# Patient Record
Sex: Female | Born: 1980 | Race: White | Hispanic: No | Marital: Single | Smoking: Never smoker
Health system: Southern US, Community
[De-identification: ages and names within clinical notes are randomized; demographics above are authoritative.]

## PROBLEM LIST (undated history)

## (undated) DIAGNOSIS — Z3490 Encounter for supervision of normal pregnancy, unspecified, unspecified trimester: Secondary | ICD-10-CM

## (undated) HISTORY — DX: Encounter for supervision of normal pregnancy, unspecified, unspecified trimester: Z34.90

---

## 2004-11-28 ENCOUNTER — Inpatient Hospital Stay: Payer: Self-pay

## 2006-01-18 ENCOUNTER — Observation Stay: Payer: Self-pay | Admitting: Unknown Physician Specialty

## 2006-01-26 ENCOUNTER — Inpatient Hospital Stay: Payer: Self-pay

## 2012-01-31 ENCOUNTER — Ambulatory Visit: Payer: Self-pay | Admitting: Internal Medicine

## 2012-10-12 HISTORY — PX: BREAST BIOPSY: SHX20

## 2013-02-09 HISTORY — PX: BREAST SURGERY: SHX581

## 2013-02-14 ENCOUNTER — Encounter: Payer: Self-pay | Admitting: General Surgery

## 2013-02-14 ENCOUNTER — Ambulatory Visit (INDEPENDENT_AMBULATORY_CARE_PROVIDER_SITE_OTHER): Payer: BC Managed Care – PPO | Admitting: General Surgery

## 2013-02-14 VITALS — BP 102/72 | HR 74 | Resp 12 | Ht 62.0 in | Wt 134.0 lb

## 2013-02-14 DIAGNOSIS — N63 Unspecified lump in unspecified breast: Secondary | ICD-10-CM | POA: Insufficient documentation

## 2013-02-14 NOTE — Progress Notes (Signed)
Patient ID: Kerry Carter, female   DOB: [birth year], 32 y.o.   MRN: [id number]  Chief Complaint  Patient presents with  . Breast Mass    HPI Kerry Carter is a 32 y.o. female here for evaluation of left breast mass. No studies completed. Patient states the mass was found by Kerry Carter,[name] on a recent check up. Patient stated she cannot feel any mass. She denies any breast symptoms. The only family history of breast cancer is a paternal grandmother and paternal aunt. HPI  Past Medical History  Diagnosis Date  . Pregnancy     History reviewed. No pertinent past surgical history.  Family History  Problem Relation Age of Onset  . Diabetes Father   . Breast cancer Paternal Grandmother 68  . Breast cancer Paternal Aunt 38    Social History History  Substance Use Topics  . Smoking status: Never Smoker   . Smokeless tobacco: Never Used  . Alcohol Use: Yes    Allergies  Allergen Reactions  . Zithromax (Azithromycin) Itching    Current Outpatient Prescriptions  Medication Sig Dispense Refill  . Adapalene-Benzoyl Peroxide (EPIDUO) 0.1-2.5 % gel Apply topically at bedtime.      . Dapsone (ACZONE) 5 % topical gel Apply topically 2 (two) times daily.       No current facility-administered medications for this visit.    Review of Systems Review of Systems  Constitutional: Negative.   HENT: Negative.   Respiratory: Negative.   Cardiovascular: Negative.     Blood pressure 102/72, pulse 74, resp. rate 12, height 5\' 2"  (1.575 m), weight 134 lb (60.782 kg), last menstrual period 01/28/2013.  Physical Exam Physical Exam  Constitutional: She is oriented to person, place, and time. She appears well-developed and well-nourished.  Eyes: Conjunctivae are normal. No scleral icterus.  Neck: Normal range of motion. Neck supple.  Cardiovascular: Normal rate, regular rhythm and normal heart sounds.   Pulmonary/Chest: Effort normal and breath sounds normal. Right breast exhibits no  inverted nipple, no mass, no nipple discharge, no skin change and no tenderness. Left breast exhibits mass (7 and 8 o'clock 3cm from nipple). Left breast exhibits no inverted nipple, no nipple discharge, no skin change and no tenderness.  7 and 8 o'clock 3cm from nipple of left breast. Ill defined slightly firm 2 cm mass mobile and non tender.  Lymphadenopathy:    She has no cervical adenopathy.    She has no axillary adenopathy.  Neurological: She is alert and oriented to person, place, and time.    Data Reviewed Targeted US over palpable mass in left breast LIQ was performed. At the 7-8o'cl location there is a mildly lobulated hypoechoic mass 2.34 by 2.32by 0.9cm size. No associated shadowing or through transmission noted but the mass is overlying a rib. The mass os homogeneous. And margins are smooth.  Assessment    Left breast mass. US showed a hypoechoic mass     Plan   Core biopsy recommended and explained in full to pt. Pt is agreeable. She had another appt today and wished to have it done another day.       Kerry Carter G 02/16/2013, 8:10 PM

## 2013-02-14 NOTE — Patient Instructions (Addendum)
The breast biopsy procedure was reviewed with the patient. The potential for bleeding, infection, and pain was reviewed. At this time, the benefits outweigh the risk and the patient is amenable to proceed.

## 2013-02-16 ENCOUNTER — Encounter: Payer: Self-pay | Admitting: General Surgery

## 2013-02-20 ENCOUNTER — Encounter: Payer: Self-pay | Admitting: General Surgery

## 2013-03-02 ENCOUNTER — Other Ambulatory Visit: Payer: Self-pay

## 2013-03-02 ENCOUNTER — Ambulatory Visit (INDEPENDENT_AMBULATORY_CARE_PROVIDER_SITE_OTHER): Payer: BC Managed Care – PPO | Admitting: General Surgery

## 2013-03-02 ENCOUNTER — Encounter: Payer: Self-pay | Admitting: General Surgery

## 2013-03-02 VITALS — BP 94/64 | HR 74 | Resp 12 | Ht 62.0 in | Wt 135.0 lb

## 2013-03-02 DIAGNOSIS — N63 Unspecified lump in unspecified breast: Secondary | ICD-10-CM

## 2013-03-02 NOTE — Progress Notes (Signed)
Patient ID: Avyana Puffenbarger, female   DOB: 1981/02/08, 32 y.o.   MRN: [id number]  Chief Complaint  Patient presents with  . Procedure    left breast biopsy    HPI Moani Weipert is a 32 y.o. female.  Patient here today for left breast biopsy. HPI  Past Medical History  Diagnosis Date  . Pregnancy     History reviewed. No pertinent past surgical history.  Family History  Problem Relation Age of Onset  . Diabetes Father   . Breast cancer Paternal Grandmother 40  . Breast cancer Paternal Aunt 35    Social History History  Substance Use Topics  . Smoking status: Never Smoker   . Smokeless tobacco: Never Used  . Alcohol Use: Yes    Allergies  Allergen Reactions  . Zithromax (Azithromycin) Itching    Current Outpatient Prescriptions  Medication Sig Dispense Refill  . Adapalene-Benzoyl Peroxide (EPIDUO) 0.1-2.5 % gel Apply topically at bedtime.      . Dapsone (ACZONE) 5 % topical gel Apply topically 2 (two) times daily.       No current facility-administered medications for this visit.    Review of Systems Review of Systems  Constitutional: Negative.   Respiratory: Negative.   Cardiovascular: Negative.     Blood pressure 94/64, pulse 74, resp. rate 12, height 5\' 2"  (1.575 m), weight 135 lb (61.236 kg), last menstrual period 02/27/2013.  Physical Exam Physical Exam  Data Reviewed    Assessment    Left breast mass     Plan    Cor biopsy completed today        Molly Maselli G 03/02/2013, 9:09 AM

## 2013-03-02 NOTE — Patient Instructions (Addendum)

## 2013-03-03 LAB — PATHOLOGY

## 2013-03-07 ENCOUNTER — Telehealth: Payer: Self-pay | Admitting: *Deleted

## 2013-03-07 NOTE — Telephone Encounter (Signed)
Notified patient as instructed that the pathology was benign per Dr Evette Cristal, patient pleased. Discussed follow-up appointments, patient agrees

## 2013-03-09 ENCOUNTER — Ambulatory Visit (INDEPENDENT_AMBULATORY_CARE_PROVIDER_SITE_OTHER): Payer: BC Managed Care – PPO | Admitting: *Deleted

## 2013-03-09 DIAGNOSIS — N63 Unspecified lump in unspecified breast: Secondary | ICD-10-CM

## 2013-03-09 NOTE — Progress Notes (Signed)
Patient here today for follow up post breast biopsy.  Dressing remains in place, steristrip in place and aware it may come off in one week.  Minimal bruising noted.  The patient is aware that a heating pad may be used for comfort as needed.  Aware of pathology. Follow up as scheduled.

## 2013-03-09 NOTE — Patient Instructions (Addendum)
The patient is aware that a heating pad may be used for comfort as needed.  Aware of pathology. Follow up as scheduled.

## 2013-05-03 ENCOUNTER — Other Ambulatory Visit: Payer: Self-pay

## 2013-05-03 ENCOUNTER — Encounter: Payer: Self-pay | Admitting: General Surgery

## 2013-05-03 ENCOUNTER — Ambulatory Visit (INDEPENDENT_AMBULATORY_CARE_PROVIDER_SITE_OTHER): Payer: BC Managed Care – PPO | Admitting: General Surgery

## 2013-05-03 VITALS — BP 86/52 | HR 70 | Resp 12 | Ht 63.0 in | Wt 139.0 lb

## 2013-05-03 DIAGNOSIS — N63 Unspecified lump in unspecified breast: Secondary | ICD-10-CM

## 2013-05-03 NOTE — Patient Instructions (Addendum)
Continue self breast exams. Call office for any new breast issues or concerns. Yearly exam in May 2015

## 2013-05-03 NOTE — Progress Notes (Signed)
Patient ID: Kerry Carter, female   DOB: Dec 12, 1980, 32 y.o.   MRN: [id number]  Chief Complaint  Patient presents with  . Follow-up    HPI Kerry Carter is a 32 y.o. female.  Patient here today for follow up left breast core biopsy done May 2014, benign.  No new breast issues. HPI  Past Medical History  Diagnosis Date  . Pregnancy     Past Surgical History  Procedure Laterality Date  . Breast surgery Left May 2014    benign    Family History  Problem Relation Age of Onset  . Diabetes Father   . Breast cancer Paternal Grandmother 38  . Breast cancer Paternal Aunt 38    Social History History  Substance Use Topics  . Smoking status: Never Smoker   . Smokeless tobacco: Never Used  . Alcohol Use: Yes    Allergies  Allergen Reactions  . Zithromax (Azithromycin) Itching    Current Outpatient Prescriptions  Medication Sig Dispense Refill  . norethindrone-ethinyl estradiol (JUNEL FE,GILDESS FE,LOESTRIN FE) 1-20 MG-MCG tablet Take 1 tablet by mouth daily.      . Adapalene-Benzoyl Peroxide (EPIDUO) 0.1-2.5 % gel Apply topically at bedtime.      . Dapsone (ACZONE) 5 % topical gel Apply topically 2 (two) times daily.       No current facility-administered medications for this visit.    Review of Systems Review of Systems  Constitutional: Negative.   Respiratory: Negative.   Cardiovascular: Negative.     Blood pressure 86/52, pulse 70, resp. rate 12, height 5\' 3"  (1.6 m), weight 139 lb (63.05 kg), last menstrual period 05/01/2013.  Physical Exam Physical Exam  Constitutional: She is oriented to person, place, and time. She appears well-developed and well-nourished.  Pulmonary/Chest: Left breast exhibits no inverted nipple, no mass, no nipple discharge, no skin change and no tenderness.  Neurological: She is alert and oriented to person, place, and time.  Skin: Skin is warm and dry.    Data Reviewed Prior ultrasound  Assessment    Left breast no masses     Plan    Yearly follow up in May       [facility] G 05/03/2013, 7:19 PM

## 2014-02-06 ENCOUNTER — Ambulatory Visit (INDEPENDENT_AMBULATORY_CARE_PROVIDER_SITE_OTHER): Payer: BC Managed Care – PPO | Admitting: General Surgery

## 2014-02-06 ENCOUNTER — Encounter: Payer: Self-pay | Admitting: General Surgery

## 2014-02-06 ENCOUNTER — Other Ambulatory Visit: Payer: BC Managed Care – PPO

## 2014-02-06 VITALS — BP 110/70 | HR 76 | Resp 12 | Ht 63.0 in | Wt 144.0 lb

## 2014-02-06 DIAGNOSIS — N63 Unspecified lump in unspecified breast: Secondary | ICD-10-CM

## 2014-02-06 NOTE — Progress Notes (Signed)
Patient ID: Kerry Carter, female   DOB: 1981/01/08, 33 y.o.   MRN: [id number]  Chief Complaint  Patient presents with  . Follow-up    HPI Kerry Carter is a 33 y.o. female.  who presents for a follow up breast evaluation.  Patient does perform regular self breast checks.  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 Denies any new breast issues.  HPI  Past Medical History  Diagnosis Date  . Pregnancy     Past Surgical History  Procedure Laterality Date  . Breast surgery Left May 2014    benign    Family History  Problem Relation Age of Onset  . Diabetes Father   . Breast cancer Paternal Grandmother 5460  . Breast cancer Paternal Aunt 3560    Social History History  Substance Use Topics  . Smoking status: Never Smoker   . Smokeless tobacco: Never Used  . Alcohol Use: Yes    Allergies  Allergen Reactions  . Zithromax [Azithromycin] Itching    Current Outpatient Prescriptions  Medication Sig Dispense Refill  . Adapalene-Benzoyl Peroxide (EPIDUO) 0.1-2.5 % gel Apply topically as needed.       . Dapsone (ACZONE) 5 % topical gel Apply topically as needed.       . norethindrone-ethinyl estradiol (JUNEL FE,GILDESS FE,LOESTRIN FE) 1-20 MG-MCG tablet Take 1 tablet by mouth daily.       No current facility-administered medications for this visit.    Review of Systems Review of Systems  Constitutional: Negative.   Respiratory: Negative.   Cardiovascular: Negative.     Blood pressure 110/70, pulse 76, resp. rate 12, height 5\' 3"  (1.6 m), weight 144 lb (65.318 kg), last menstrual period 01/11/2014.  Physical Exam Physical Exam  Constitutional: She is oriented to person, place, and time. She appears well-developed and well-nourished.  Eyes: Conjunctivae are normal.  Neck: Neck supple.  Pulmonary/Chest: Right breast exhibits no inverted nipple, no mass, no nipple discharge, no skin change and no tenderness. Left breast exhibits mass. Left  breast exhibits no inverted nipple, no nipple discharge, no skin change and no tenderness.  3 cm from nipple a palpable mass at 7-8 o'clock left breast. This is unchanged from before.  Lymphadenopathy:    She has no cervical adenopathy.    She has no axillary adenopathy.  Neurological: She is alert and oriented to person, place, and time.  Skin: Skin is warm and dry.    Data Reviewed Targeted US of left breast mass shows no defined mass.   Assessment    Stable benign left breast mass.     Plan    Yearly f/u.        Donicia Druck G Kordae Buonocore 02/07/2014, 5:56 AM

## 2014-02-06 NOTE — Patient Instructions (Signed)
Continue self breast exams. Call office for any new breast issues or concerns. 

## 2014-02-07 ENCOUNTER — Encounter: Payer: Self-pay | Admitting: General Surgery

## 2014-08-13 ENCOUNTER — Encounter: Payer: Self-pay | Admitting: General Surgery

## 2015-01-30 ENCOUNTER — Ambulatory Visit (INDEPENDENT_AMBULATORY_CARE_PROVIDER_SITE_OTHER): Payer: BLUE CROSS/BLUE SHIELD | Admitting: General Surgery

## 2015-01-30 ENCOUNTER — Encounter: Payer: Self-pay | Admitting: General Surgery

## 2015-01-30 VITALS — BP 118/70 | HR 88 | Resp 12 | Ht 63.0 in | Wt 148.0 lb

## 2015-01-30 DIAGNOSIS — N63 Unspecified lump in unspecified breast: Secondary | ICD-10-CM

## 2015-01-30 DIAGNOSIS — Z803 Family history of malignant neoplasm of breast: Secondary | ICD-10-CM | POA: Diagnosis not present

## 2015-01-30 NOTE — Progress Notes (Signed)
Patient ID: Kerry Carter, female   DOB: 02-20-1981, 34 y.o.   MRN: [id number]  Chief Complaint  Patient presents with  . Follow-up    breast exam    HPI Kerry Carter is a 34 y.o. female.  who presents for a follow up breast evaluation.  Patient does perform regular self breast checks. She had a left breast core biopsy for palpable mass May 2014-pathology showing benign fibrosis. Denies any new breast issues.   HPI  Past Medical History  Diagnosis Date  . Pregnancy     Past Surgical History  Procedure Laterality Date  . Breast surgery Left May 2014    benign fibrosis    Family History  Problem Relation Age of Onset  . Diabetes Father   . Breast cancer Paternal Grandmother 6460  . Breast cancer Paternal Aunt 7160    Social History History  Substance Use Topics  . Smoking status: Never Smoker   . Smokeless tobacco: Never Used  . Alcohol Use: Yes    Allergies  Allergen Reactions  . Zithromax [Azithromycin] Itching    Current Outpatient Prescriptions  Medication Sig Dispense Refill  . loratadine (CLARITIN) 10 MG tablet Take 10 mg by mouth daily.     No current facility-administered medications for this visit.    Review of Systems Review of Systems  Blood pressure 118/70, pulse 88, resp. rate 12, height 5\' 3"  (1.6 m), weight 148 lb (67.132 kg), last menstrual period 01/21/2015.  Physical Exam Physical Exam  Constitutional: She is oriented to person, place, and time. She appears well-developed and well-nourished.  Eyes: Conjunctivae are normal. No scleral icterus.  Neck: Neck supple.  Cardiovascular: Normal rate, regular rhythm and normal heart sounds.   Pulmonary/Chest: Effort normal and breath sounds normal. Right breast exhibits no inverted nipple, no mass, no nipple discharge, no skin change and no tenderness. Left breast exhibits mass. Left breast exhibits no inverted nipple, no nipple discharge, no skin change and no tenderness.  1 cm ovoid mass at 7  o'clock 4-5 CFN left breast.  Abdominal: Soft. Normal appearance. There is no tenderness.  Lymphadenopathy:    She has no cervical adenopathy.    She has no axillary adenopathy.  Neurological: She is alert and oriented to person, place, and time.  Skin: Skin is warm and dry.  Left breast mass is  unchganged from prior yrs.  Data Reviewed Office notes.  Assessment    Stable physical exam- mass left breast, benign. Remote FH of breast cancer    Plan    The patient has been asked to return to the office in one year for breast follow up.  Continue self breast exams. Call office for any new breast issues or concerns.     PCP:  Rise PatienceKing, Crystal M  Murriel Holwerda G 01/30/2015, 4:29 PM

## 2015-01-30 NOTE — Patient Instructions (Signed)
Continue self breast exams. Call office for any new breast issues or concerns. 

## 2015-02-05 ENCOUNTER — Ambulatory Visit: Payer: Self-pay | Admitting: General Surgery

## 2015-11-19 ENCOUNTER — Encounter: Payer: Self-pay | Admitting: *Deleted

## 2016-01-30 ENCOUNTER — Ambulatory Visit: Payer: Self-pay | Admitting: General Surgery

## 2016-02-12 ENCOUNTER — Encounter: Payer: Self-pay | Admitting: General Surgery

## 2016-02-12 ENCOUNTER — Ambulatory Visit (INDEPENDENT_AMBULATORY_CARE_PROVIDER_SITE_OTHER): Payer: BLUE CROSS/BLUE SHIELD | Admitting: General Surgery

## 2016-02-12 VITALS — BP 100/64 | HR 81 | Resp 13 | Ht 63.0 in | Wt 151.0 lb

## 2016-02-12 DIAGNOSIS — N6320 Unspecified lump in the left breast, unspecified quadrant: Secondary | ICD-10-CM

## 2016-02-12 DIAGNOSIS — N63 Unspecified lump in breast: Secondary | ICD-10-CM | POA: Diagnosis not present

## 2016-02-12 NOTE — Patient Instructions (Addendum)
Continue self breast exams. Call office for any new breast issues or concerns. Follow up with GYN for breast care.

## 2016-02-12 NOTE — Progress Notes (Signed)
Patient ID: Kerry Carter, female   DOB: [birth year], 35 y.o.   MRN: [id number]  Chief Complaint  Patient presents with  . Follow-up    HPI Kerry Carter is a 35 y.o. female following up for a left breast mass. 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 Patient does perform regular self breast checks. She denies any new breast problems.  I have reviewed the history of present illness with the patient.   HPI  Past Medical History  Diagnosis Date  . Pregnancy     Past Surgical History  Procedure Laterality Date  . Breast surgery Left May 2014    benign fibrosis    Family History  Problem Relation Age of Onset  . Diabetes Father   . Breast cancer Paternal Grandmother 5060  . Breast cancer Paternal Aunt 3060    Social History Social History  Substance Use Topics  . Smoking status: Never Smoker   . Smokeless tobacco: Never Used  . Alcohol Use: Yes    Allergies  Allergen Reactions  . Zithromax [Azithromycin] Itching    Current Outpatient Prescriptions  Medication Sig Dispense Refill  . fluticasone (FLONASE) 50 MCG/ACT nasal spray SPRAY TWO SPRAYS IN EACH NOSTRIL ONCE DAILY  12  . ketoconazole (NIZORAL) 2 % shampoo APPLY 1 APPLICATION TO AFFECTED AREA AND LET SIT FOR 10 MINS THEN RINSE. USE 2 TIMES/WEEK FOR 6 WKS  11  . loratadine (CLARITIN) 10 MG tablet Take 10 mg by mouth daily.    . valACYclovir (VALTREX) 500 MG tablet as needed.  0   No current facility-administered medications for this visit.    Review of Systems Review of Systems  Constitutional: Negative.   Respiratory: Negative.   Cardiovascular: Negative.     Blood pressure 100/64, pulse 81, resp. rate 13, height 5\' 3"  (1.6 m), weight 151 lb (68.493 kg), last menstrual period 01/13/2016.  Physical Exam Physical Exam  Constitutional: She is oriented to person, place, and time. She appears well-developed and well-nourished.  Eyes: Conjunctivae are normal. No scleral icterus.  Neck: Neck supple.   Cardiovascular: Normal rate, regular rhythm and normal heart sounds.   Pulmonary/Chest: Effort normal and breath sounds normal. Right breast exhibits no inverted nipple, no mass, no nipple discharge, no skin change and no tenderness. Left breast exhibits mass (no changes felt). Left breast exhibits no inverted nipple, no nipple discharge, no skin change and no tenderness.    Abdominal: Soft. Bowel sounds are normal.  Lymphadenopathy:    She has no cervical adenopathy.    She has no axillary adenopathy.  Neurological: She is alert and oriented to person, place, and time.  Skin: Skin is warm and dry.  Psychiatric: She has a normal mood and affect.    Data Reviewed Previous notes  Assessment    Patient has 1cm ovoid mass at the 7 o'clock position. 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 It has not changed in over three years.    Plan    Patient to call the office with any concerns or new breast issues. Patient is advised to have mammogram done at the age of 35. She is okay with having her breast exams done by her GYN on a yearly basis and to return here PRN for any new problems.    PCP:  Barbette MerinoKing, Crystal M This has been scribed by Sinda Duaryl-Lyn M Kennedy LPN    [facility] CenterANKAR,Salma Walrond G 02/13/2016, 8:51 AM

## 2016-02-13 ENCOUNTER — Encounter: Payer: Self-pay | Admitting: General Surgery

## 2018-11-04 ENCOUNTER — Other Ambulatory Visit: Payer: Self-pay | Admitting: Student

## 2018-11-04 DIAGNOSIS — Z1231 Encounter for screening mammogram for malignant neoplasm of breast: Secondary | ICD-10-CM

## 2018-12-20 ENCOUNTER — Encounter (INDEPENDENT_AMBULATORY_CARE_PROVIDER_SITE_OTHER): Payer: Self-pay

## 2018-12-20 ENCOUNTER — Ambulatory Visit
Admission: RE | Admit: 2018-12-20 | Discharge: 2018-12-20 | Disposition: A | Discharge status: Home / Self Care | Payer: BLUE CROSS/BLUE SHIELD | Source: Ambulatory Visit | Attending: Student | Admitting: Student

## 2018-12-20 DIAGNOSIS — Z1231 Encounter for screening mammogram for malignant neoplasm of breast: Secondary | ICD-10-CM | POA: Diagnosis present

## 2018-12-26 ENCOUNTER — Other Ambulatory Visit: Payer: Self-pay | Admitting: Student

## 2018-12-26 DIAGNOSIS — N6489 Other specified disorders of breast: Secondary | ICD-10-CM

## 2018-12-26 DIAGNOSIS — R928 Other abnormal and inconclusive findings on diagnostic imaging of breast: Secondary | ICD-10-CM

## 2018-12-29 ENCOUNTER — Other Ambulatory Visit: Payer: Self-pay

## 2018-12-29 ENCOUNTER — Ambulatory Visit
Admission: RE | Admit: 2018-12-29 | Discharge: 2018-12-29 | Disposition: A | Discharge status: Home / Self Care | Payer: BLUE CROSS/BLUE SHIELD | Source: Ambulatory Visit | Attending: Student | Admitting: Student

## 2018-12-29 DIAGNOSIS — R928 Other abnormal and inconclusive findings on diagnostic imaging of breast: Secondary | ICD-10-CM | POA: Insufficient documentation

## 2018-12-29 DIAGNOSIS — N6489 Other specified disorders of breast: Secondary | ICD-10-CM | POA: Diagnosis present

## 2019-08-28 ENCOUNTER — Other Ambulatory Visit: Payer: Self-pay

## 2019-08-28 DIAGNOSIS — Z20822 Contact with and (suspected) exposure to covid-19: Secondary | ICD-10-CM

## 2019-08-30 LAB — NOVEL CORONAVIRUS, NAA: SARS-CoV-2, NAA: DETECTED — AB

## 2019-09-12 ENCOUNTER — Other Ambulatory Visit: Payer: Self-pay

## 2019-09-12 DIAGNOSIS — Z20822 Contact with and (suspected) exposure to covid-19: Secondary | ICD-10-CM

## 2019-09-14 LAB — NOVEL CORONAVIRUS, NAA: SARS-CoV-2, NAA: NOT DETECTED

## 2020-03-11 IMAGING — MG DIGITAL DIAGNOSTIC UNILATERAL LEFT MAMMOGRAM WITH TOMO AND CAD
4 series · 4 of 12 positions shown · non-contrast
Comparison: None.

CLINICAL DATA: Screening recall from baseline mammography for
possible left breast asymmetry.

EXAM:
DIGITAL DIAGNOSTIC UNILATERAL LEFT MAMMOGRAM WITH CAD AND TOMO
LEFT BREAST ULTRASOUND

[L ML synth-2D]
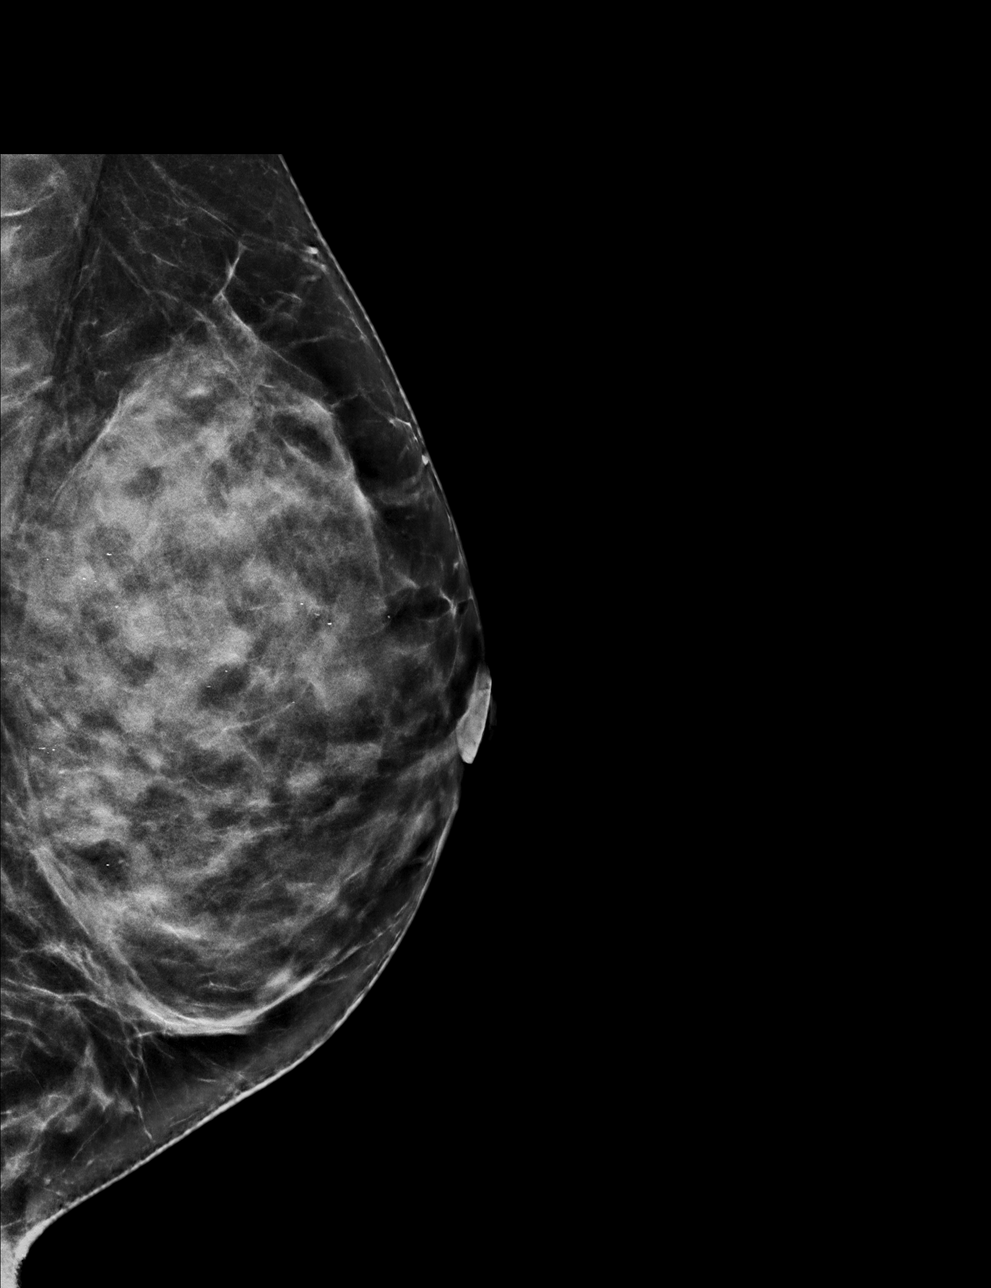

[L CC synth-2D]
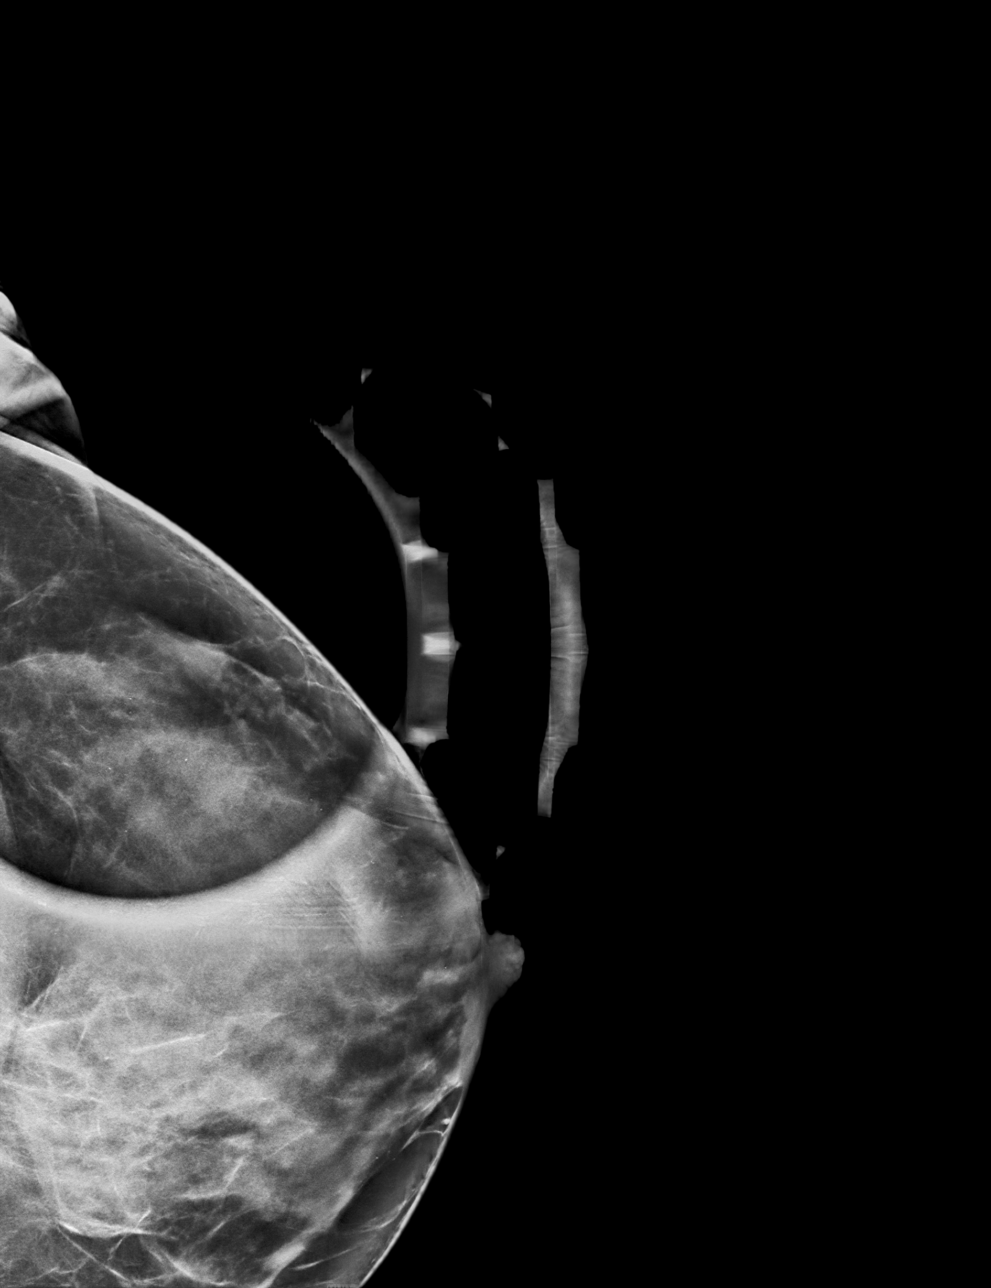

[L CC tomo · tomo slice 33/66.0]
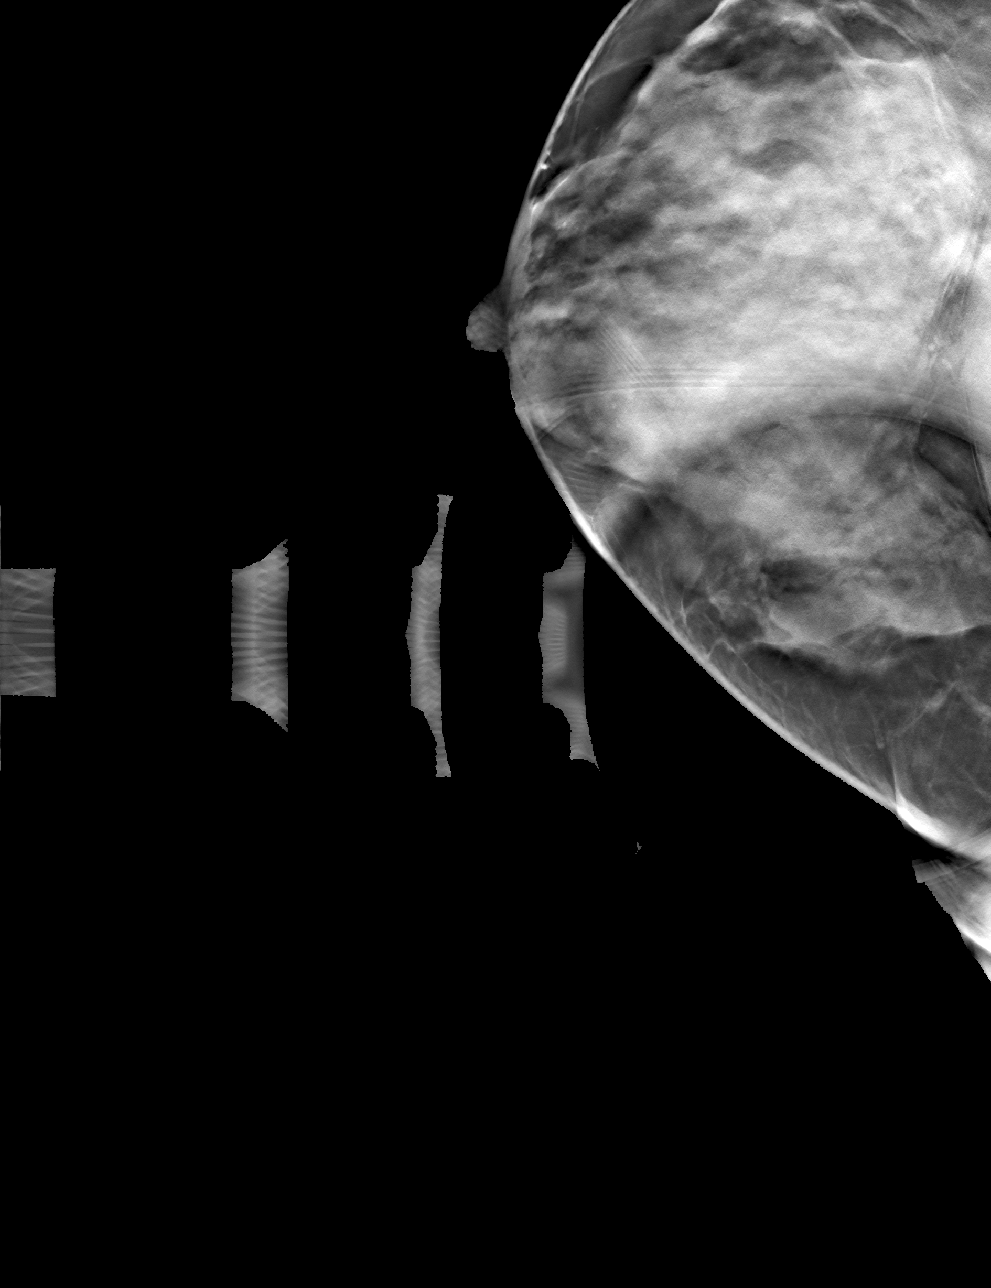

[L ML tomo · tomo slice 35/68.0]
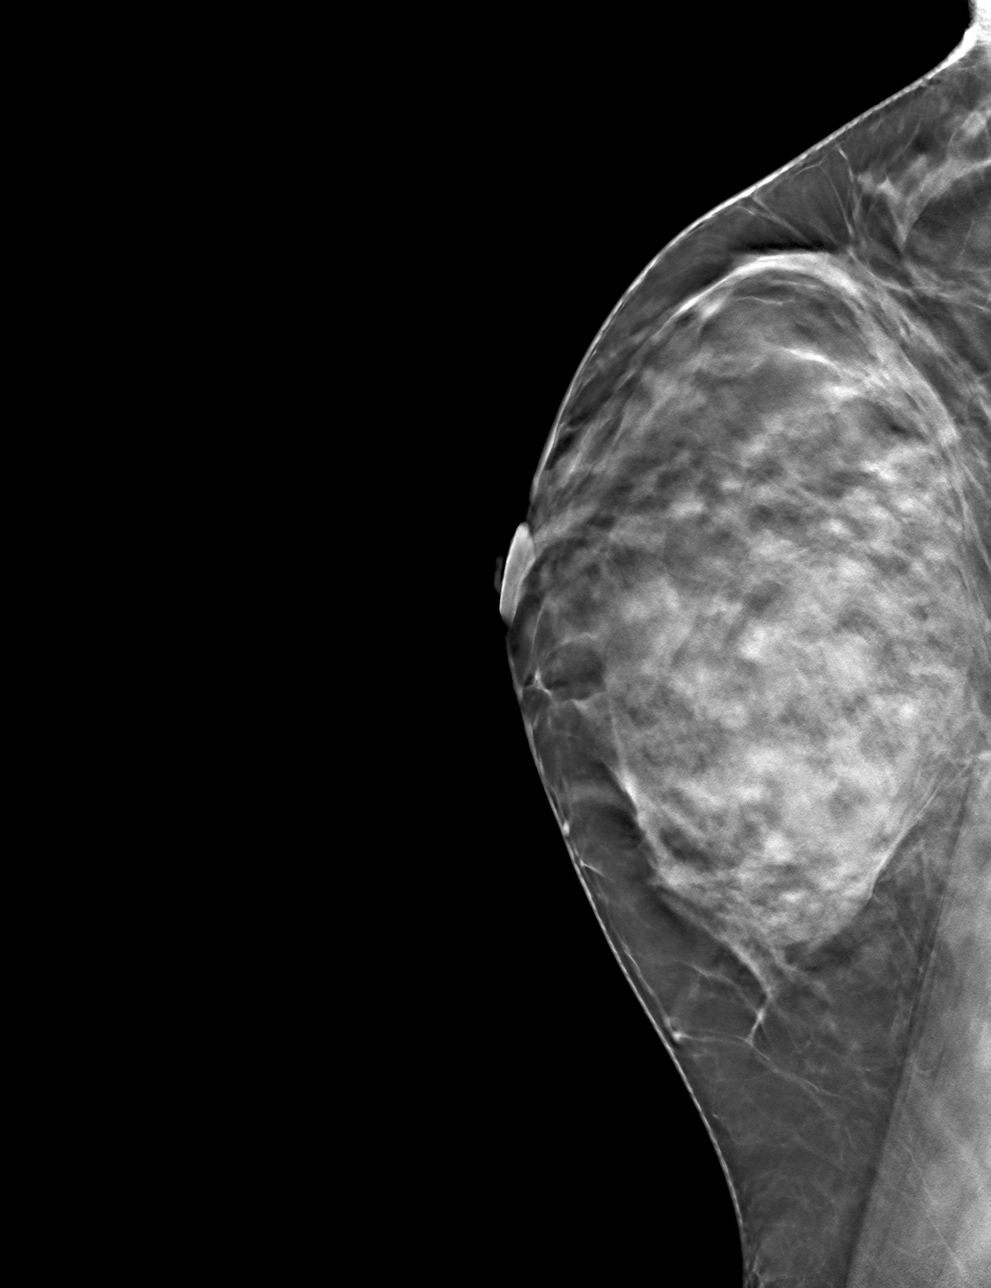

[4 of 12 positions shown; findings below may reference images not displayed]

ACR Breast Density Category d: The breast tissue is extremely dense,
which lowers the sensitivity of mammography.
FINDINGS: Additional tomograms were performed of the left breast. The
initially questioned possible left breast asymmetry appears to
resolve on the additional imaging with findings suggestive of an
area of dense fibroglandular tissue.

Mammographic images were processed with CAD.

Targeted ultrasound of the upper-outer left breast was performed
demonstrating numerous cysts, with the largest at 234 cm from the
nipple measuring 0.9 x 0.6 x 0.8 cm. A smaller cyst at 2 o'clock 3
cm from nipple measures 0.6 x 0.6 x 0.6 cm. No suspicious masses or
abnormality seen in the upper-outer left breast.
IMPRESSION: Left breast cysts. There are no findings of malignancy in the left
breast.

RECOMMENDATION:
Screening mammogram at age 42 unless there are persistent or
intervening clinical concerns. (Code:1I-O-4EP)

I have discussed the findings and recommendations with the patient.
Results were also provided in writing at the conclusion of the
visit. If applicable, a reminder letter will be sent to the patient
regarding the next appointment.

BI-RADS CATEGORY  2: Benign.

## 2020-03-11 IMAGING — US ULTRASOUND LEFT BREAST LIMITED
1 series · 13 of 13 positions shown · non-contrast
Comparison: None.

CLINICAL DATA: Screening recall from baseline mammography for
possible left breast asymmetry.

EXAM:
DIGITAL DIAGNOSTIC UNILATERAL LEFT MAMMOGRAM WITH CAD AND TOMO
LEFT BREAST ULTRASOUND

[Series 1: ultrasound left breast limited · 0.06mm/px · 13 of 13 slices shown]
[im 1/13]
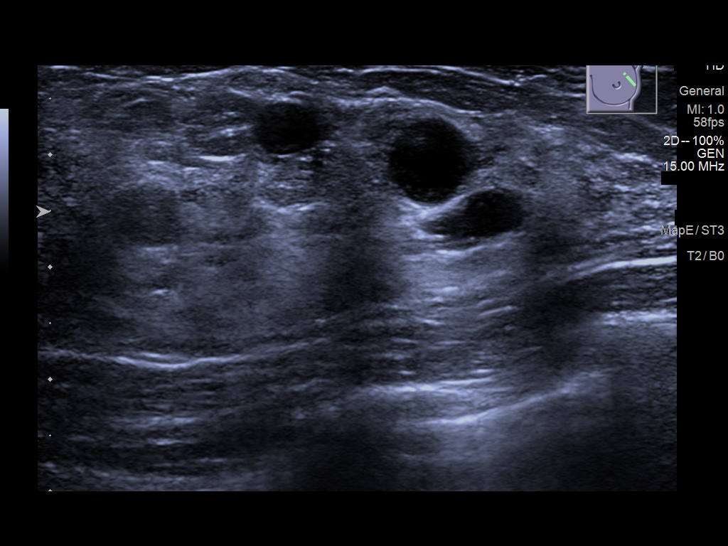
[im 2/13]
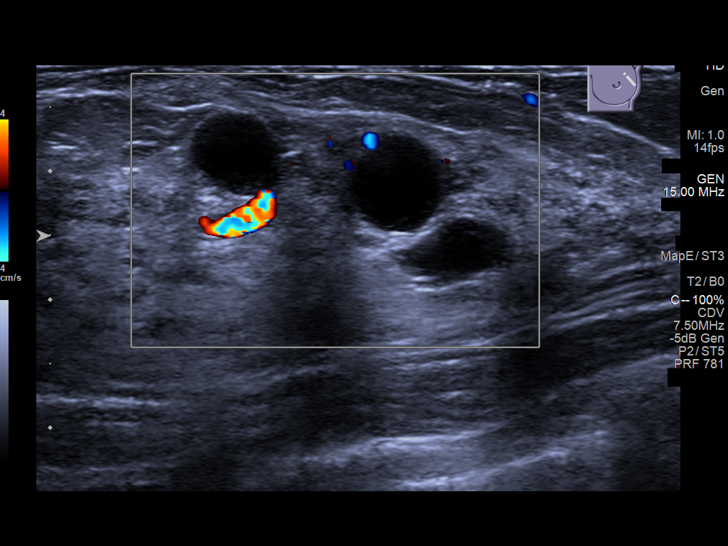
[im 3/13]
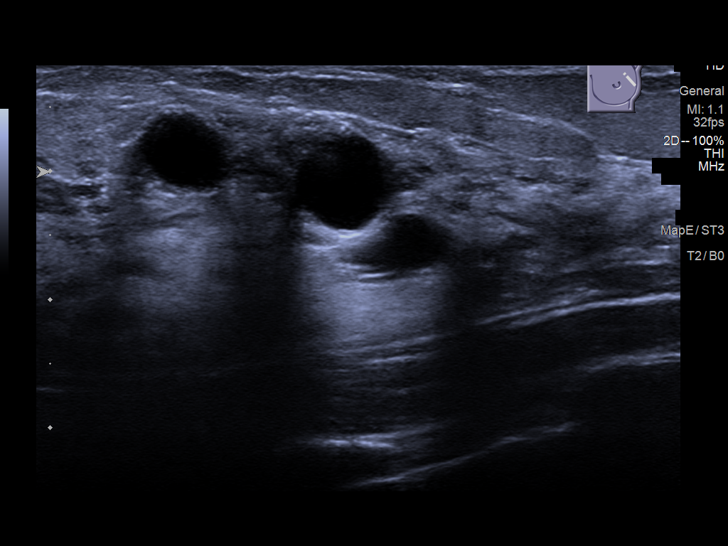
[im 4/13]
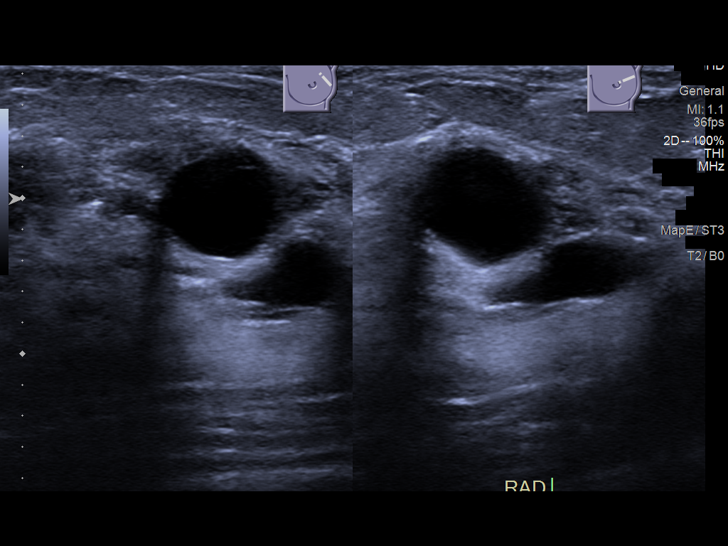
[im 5/13]
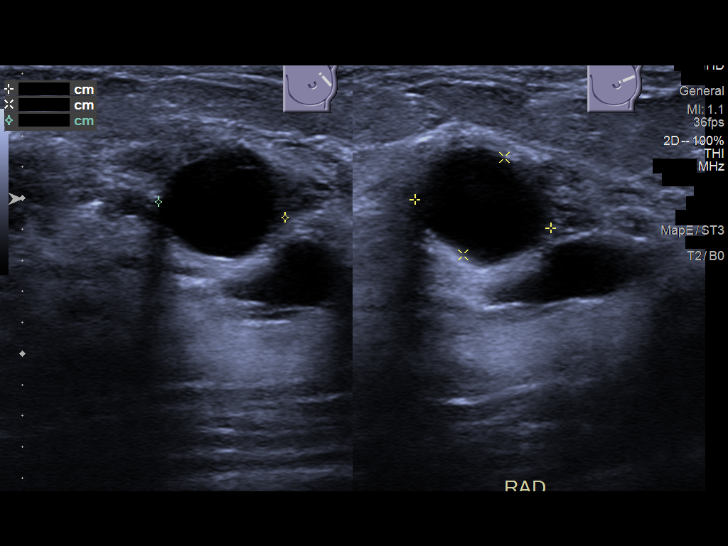
[im 6/13]
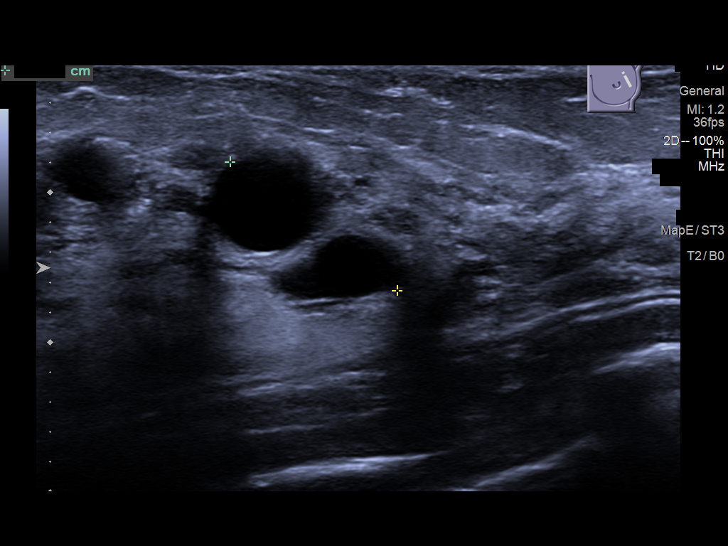
[im 7/13]
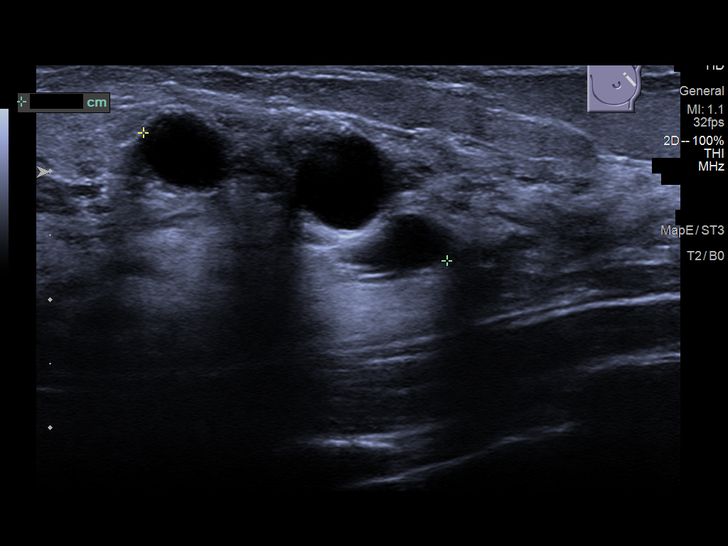
[im 8/13]
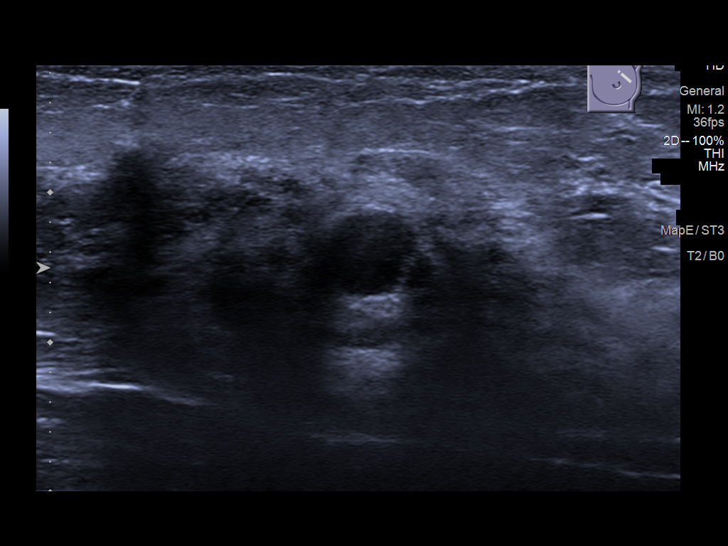
[im 9/13]
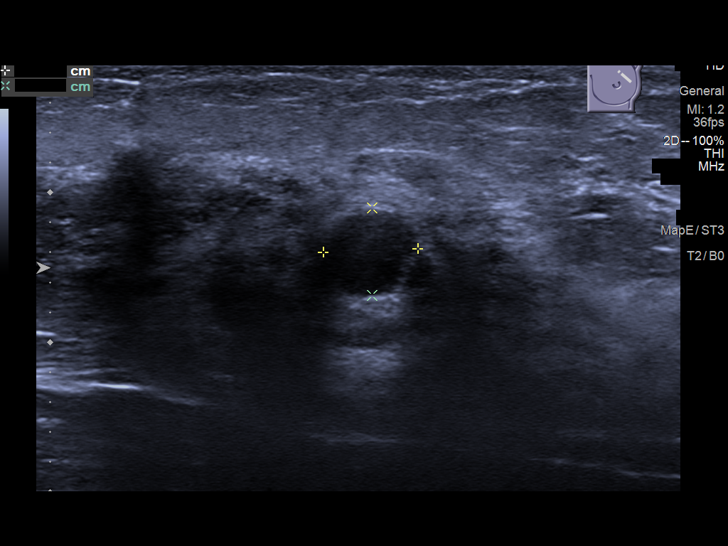
[im 10/13]
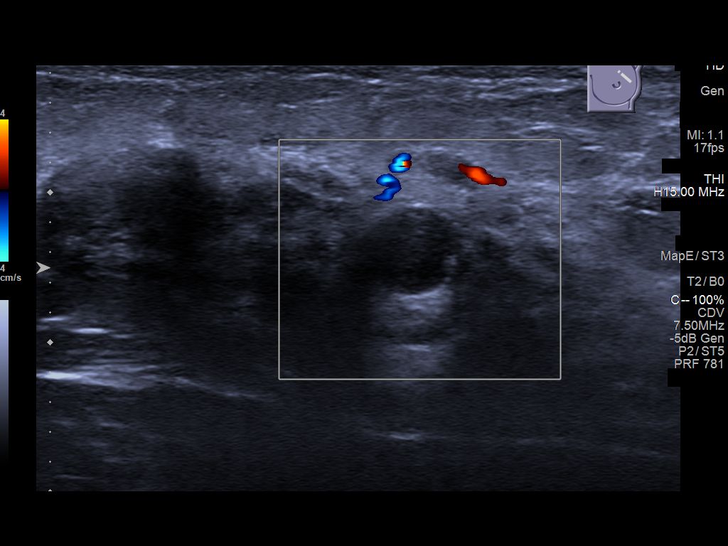
[im 11/13]
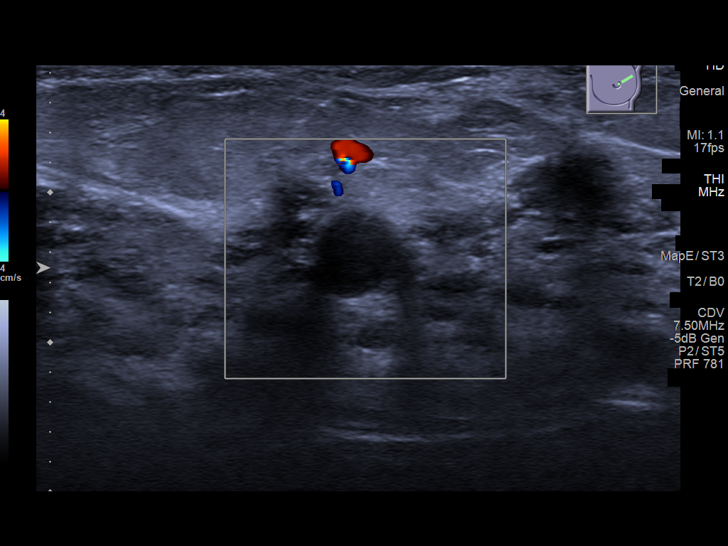
[im 12/13]
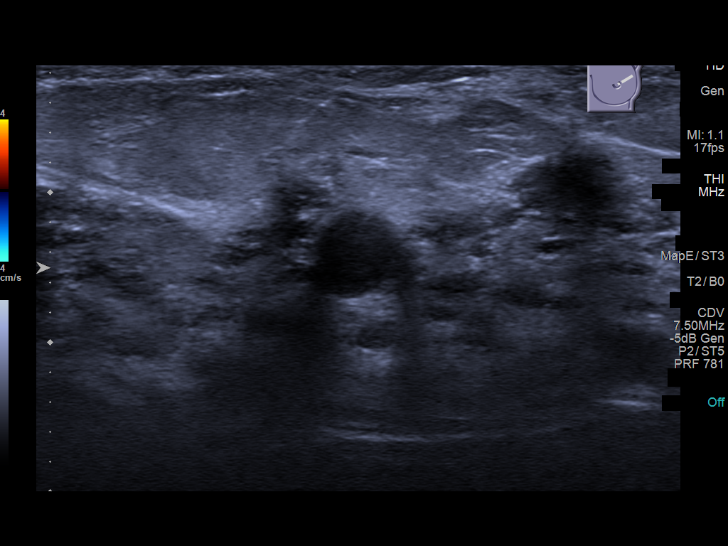
[im 13/13]
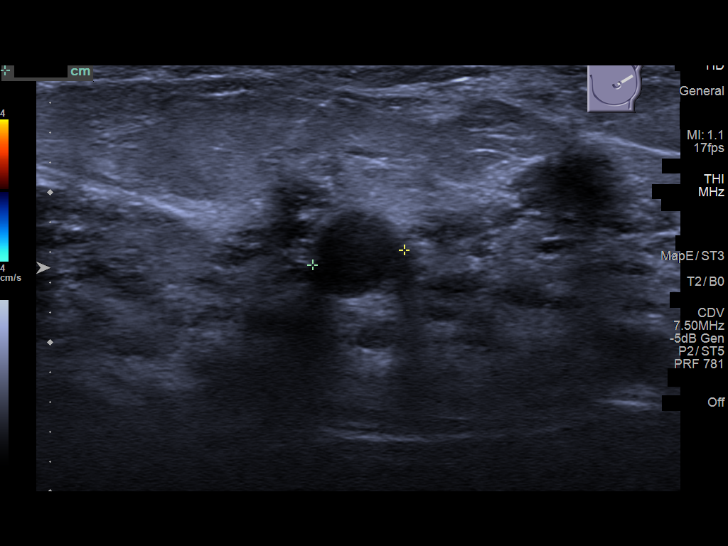

[13 of 13 positions shown; findings below may reference images not displayed]

ACR Breast Density Category d: The breast tissue is extremely dense,
which lowers the sensitivity of mammography.
FINDINGS: Additional tomograms were performed of the left breast. The
initially questioned possible left breast asymmetry appears to
resolve on the additional imaging with findings suggestive of an
area of dense fibroglandular tissue.

Mammographic images were processed with CAD.

Targeted ultrasound of the upper-outer left breast was performed
demonstrating numerous cysts, with the largest at 234 cm from the
nipple measuring 0.9 x 0.6 x 0.8 cm. A smaller cyst at 2 o'clock 3
cm from nipple measures 0.6 x 0.6 x 0.6 cm. No suspicious masses or
abnormality seen in the upper-outer left breast.
IMPRESSION: Left breast cysts. There are no findings of malignancy in the left
breast.

RECOMMENDATION:
Screening mammogram at age 42 unless there are persistent or
intervening clinical concerns. (Code:1I-O-4EP)

I have discussed the findings and recommendations with the patient.
Results were also provided in writing at the conclusion of the
visit. If applicable, a reminder letter will be sent to the patient
regarding the next appointment.

BI-RADS CATEGORY  2: Benign.
